# Patient Record
Sex: Male | Born: 1995 | Race: Asian | Hispanic: No | Marital: Single | Smoking: Never smoker
Health system: Southern US, Community
[De-identification: ages and names within clinical notes are randomized; demographics above are authoritative.]

## PROBLEM LIST (undated history)

## (undated) DIAGNOSIS — F419 Anxiety disorder, unspecified: Secondary | ICD-10-CM

## (undated) DIAGNOSIS — R7989 Other specified abnormal findings of blood chemistry: Secondary | ICD-10-CM

## (undated) DIAGNOSIS — R0789 Other chest pain: Secondary | ICD-10-CM

## (undated) HISTORY — DX: Other chest pain: R07.89

## (undated) HISTORY — DX: Other specified abnormal findings of blood chemistry: R79.89

## (undated) HISTORY — DX: Anxiety disorder, unspecified: F41.9

---

## 2018-01-03 DIAGNOSIS — M545 Low back pain: Secondary | ICD-10-CM

## 2018-01-03 DIAGNOSIS — G8929 Other chronic pain: Secondary | ICD-10-CM | POA: Insufficient documentation

## 2018-07-01 ENCOUNTER — Emergency Department (HOSPITAL_BASED_OUTPATIENT_CLINIC_OR_DEPARTMENT_OTHER)
Admission: EM | Admit: 2018-07-01 | Discharge: 2018-07-02 | Disposition: A | Discharge status: Home / Self Care | Payer: PRIVATE HEALTH INSURANCE | Attending: Emergency Medicine | Admitting: Emergency Medicine

## 2018-07-01 ENCOUNTER — Encounter (HOSPITAL_BASED_OUTPATIENT_CLINIC_OR_DEPARTMENT_OTHER): Payer: Self-pay | Admitting: *Deleted

## 2018-07-01 ENCOUNTER — Other Ambulatory Visit: Payer: Self-pay

## 2018-07-01 ENCOUNTER — Emergency Department (HOSPITAL_BASED_OUTPATIENT_CLINIC_OR_DEPARTMENT_OTHER): Payer: PRIVATE HEALTH INSURANCE

## 2018-07-01 DIAGNOSIS — F419 Anxiety disorder, unspecified: Secondary | ICD-10-CM

## 2018-07-01 DIAGNOSIS — R0789 Other chest pain: Secondary | ICD-10-CM | POA: Diagnosis not present

## 2018-07-01 DIAGNOSIS — R7989 Other specified abnormal findings of blood chemistry: Secondary | ICD-10-CM

## 2018-07-01 DIAGNOSIS — R778 Other specified abnormalities of plasma proteins: Secondary | ICD-10-CM

## 2018-07-01 DIAGNOSIS — R079 Chest pain, unspecified: Secondary | ICD-10-CM | POA: Diagnosis present

## 2018-07-01 LAB — CBC
HCT: 49.8 % (ref 39.0–52.0)
Hemoglobin: 16.2 g/dL (ref 13.0–17.0)
MCH: 29 pg (ref 26.0–34.0)
MCHC: 32.5 g/dL (ref 30.0–36.0)
MCV: 89.2 fL (ref 80.0–100.0)
PLATELETS: 271 10*3/uL (ref 150–400)
RBC: 5.58 MIL/uL (ref 4.22–5.81)
RDW: 13.2 % (ref 11.5–15.5)
WBC: 9.2 10*3/uL (ref 4.0–10.5)
nRBC: 0 % (ref 0.0–0.2)

## 2018-07-01 LAB — BASIC METABOLIC PANEL
Anion gap: 7 (ref 5–15)
BUN: 9 mg/dL (ref 6–20)
CO2: 27 mmol/L (ref 22–32)
Calcium: 9.1 mg/dL (ref 8.9–10.3)
Chloride: 102 mmol/L (ref 98–111)
Creatinine, Ser: 0.97 mg/dL (ref 0.61–1.24)
GFR calc Af Amer: >60 mL/min (ref >60)
Glucose, Bld: 92 mg/dL (ref 70–99)
POTASSIUM: 3.8 mmol/L (ref 3.5–5.1)
Sodium: 136 mmol/L (ref 135–145)

## 2018-07-01 LAB — D-DIMER, QUANTITATIVE: D-Dimer, Quant: <0.27 ug/mL-FEU (ref 0.00–0.50)

## 2018-07-01 LAB — TROPONIN I: TROPONIN I: 0.05 ng/mL — AB (ref <0.03)

## 2018-07-01 MED ORDER — LORAZEPAM 1 MG PO TABS
1.0000 mg | ORAL_TABLET | Freq: Once | ORAL | Status: AC
  Administered 2018-07-01: 1 mg via ORAL
  Filled 2018-07-01: qty 1

## 2018-07-01 MED ORDER — NAPROXEN 250 MG PO TABS
500.0000 mg | ORAL_TABLET | Freq: Once | ORAL | Status: AC
  Administered 2018-07-01: 500 mg via ORAL
  Filled 2018-07-01: qty 2

## 2018-07-01 NOTE — ED Notes (Signed)
Patient transported to X-ray 

## 2018-07-01 NOTE — ED Notes (Addendum)
Troponin 0.05. Dr. Wilkie AyeHorton aware and primary RN aware.

## 2018-07-01 NOTE — ED Notes (Signed)
Pt was asleep and resting when RN entered to introduce self and assess patient

## 2018-07-01 NOTE — ED Notes (Signed)
Spoke with Ted in lab and added on D-dimer

## 2018-07-01 NOTE — ED Triage Notes (Signed)
Pt reports chest pain x 2 days, worse "when I'm anxious"

## 2018-07-02 LAB — TROPONIN I: Troponin I: 0.04 ng/mL (ref <0.03)

## 2018-07-02 MED ORDER — INDOMETHACIN 50 MG PO CAPS: 50.0000 mg | ORAL_CAPSULE | Freq: Two times a day (BID) | ORAL | 0 refills | Status: DC

## 2018-07-02 NOTE — ED Notes (Signed)
Pt understood dc material. NAD noted. Script given at dc  

## 2018-07-02 NOTE — Discharge Instructions (Addendum)
Follow-up with cardiology for further evaluation and possible echocardiogram.  You were seen today for chest pain.  Your pain is very atypical and related to anxiety and stress.  However, 1 of your labs tests was mildly elevated.  You are very low risk for heart disease. Take indomethacin for pain.  If you develop worsening pain, positional pain, pain with exertion, fevers, shortness of breath, any new or worsening symptoms you should be reevaluated.

## 2018-07-02 NOTE — ED Notes (Signed)
Pt states he feels better. Resting quietly. Denies any cp/sob. resp even and unlabored.

## 2018-07-02 NOTE — ED Provider Notes (Signed)
[facility] EMERGENCY DEPARTMENT Provider Note   CSN: [id number] Arrival date & time: 07/01/18  2219     History   Chief Complaint Chief Complaint  Patient presents with  . Chest Pain    HPI Francis Taylor is a 22 y.o. male.  HPI  This is a 22 year old male who presents with chest pain.  Patient reports 2-day history of chest pain.  He describes the pain as pressure and nonradiating.  He states the pain mostly comes on when he is anxious and stressed.  He describes worsening pain when merging into traffic and dealing with stressful situations with his family.  He denies any exertional component to the pain.  Denies any positional component.  Pain is not worse with breathing.  Denies any history of blood clots.  Currently he rates his pain at 2 out of 10.  He has not taken anything for the pain.  Denies any recent fevers or infectious symptoms.  Denies cough.  Denies early family history of heart disease, hypertension, hyperlipidemia, diabetes, smoking.  History reviewed. No pertinent past medical history.  There are no active problems to display for this patient.   History reviewed. No pertinent surgical history.      Home Medications    Prior to Admission medications   Medication Sig Start Date End Date Taking? Authorizing Provider  indomethacin (INDOCIN) 50 MG capsule Take 1 capsule (50 mg total) by mouth 2 (two) times daily with a meal. 07/02/18   Laelynn Blizzard, Mayer Masker, MD    Family History No family history on file.  Social History Social History   Tobacco Use  . Smoking status: Never Smoker  . Smokeless tobacco: Never Used  Substance Use Topics  . Alcohol use: Yes    Comment: weekly  . Drug use: Never     Allergies   Patient has no known allergies.   Review of Systems Review of Systems  Constitutional: Negative for fever.  Respiratory: Negative for shortness of breath.   Cardiovascular: Positive for chest pain. Negative for leg swelling.    Gastrointestinal: Negative for nausea and vomiting.  Genitourinary: Negative for dysuria.  Musculoskeletal: Negative for back pain.  All other systems reviewed and are negative.    Physical Exam Updated Vital Signs BP 114/76   Pulse 62   Temp 97.7 F (36.5 C)   Resp 19   Ht 1.778 m (5\' 10" )   Wt 68 kg   SpO2 99%   BMI 21.52 kg/m   Physical Exam  Constitutional: He is oriented to person, place, and time. He appears well-developed and well-nourished. No distress.  HENT:  Head: Normocephalic and atraumatic.  Eyes: Pupils are equal, round, and reactive to light.  Neck: Neck supple.  Cardiovascular: Normal rate, regular rhythm, normal heart sounds and normal pulses.  No murmur heard. Pulmonary/Chest: Effort normal and breath sounds normal. No respiratory distress. He has no wheezes.  Abdominal: Soft. Bowel sounds are normal. There is no tenderness. There is no rebound.  Musculoskeletal: He exhibits no edema.  Lymphadenopathy:    He has no cervical adenopathy.  Neurological: He is alert and oriented to person, place, and time.  Skin: Skin is warm and dry.  Psychiatric: He has a normal mood and affect.  Nursing note and vitals reviewed.    ED Treatments / Results  Labs (all labs ordered are listed, but only abnormal results are displayed) Labs Reviewed  TROPONIN I - Abnormal; Notable for the following components:  Result Value   Troponin I 0.05 (*)    All other components within normal limits  TROPONIN I - Abnormal; Notable for the following components:   Troponin I 0.04 (*)    All other components within normal limits  BASIC METABOLIC PANEL  CBC  D-DIMER, QUANTITATIVE (NOT AT [facility])    EKG EKG Interpretation  Date/Time:  Sunday July 01 2018 22:32:32 EST Ventricular Rate:  62 PR Interval:  152 QRS Duration: 80 QT Interval:  384 QTC Calculation: 389 R Axis:   75 Text Interpretation:  Normal sinus rhythm Normal ECG Confirmed by Kenyan Karnes  ([id number]) on 07/01/2018 11:00:03 PM   Radiology Dg Chest 2 View  Result Date: 07/01/2018 CLINICAL DATA:  22 year old male with chest pain. EXAM: CHEST - 2 VIEW COMPARISON:  None. FINDINGS: The heart size and mediastinal contours are within normal limits. Both lungs are clear. The visualized skeletal structures are unremarkable. IMPRESSION: No active cardiopulmonary disease. Electronically Signed   By: Arash  Radparvar M.D.   On: 07/01/2018 22:57    Procedures Procedures (including critical care time)  Medications Ordered in ED Medications  LORazepam (ATIVAN) tablet 1 mg (1 mg Oral Given 07/01/18 2350)  naproxen (NAPROSYN) tablet 500 mg (500 mg Oral Given 07/01/18 2350)     Initial Impression / Assessment and Plan / ED Course  I have reviewed the triage vital signs and the nursing notes.  Pertinent labs & imaging results that were available during my care of the patient were reviewed by me and considered in my medical decision making (see chart for details).     This is a 22 year old male who presents with chest pain.  Very atypical in nature.  Appears to be mostly associated with stress and anxiety.  He appears anxious on exam.  Vital signs are within normal limits.  EKG is normal.  No signs of ischemia or arrhythmia.  Chest x-ray shows no evidence of pneumothorax or pneumonia.  Troponin minimally elevated at 0.05.  This is of unclear significance.  I do not feel that this represents true ACS.  He is low risk for blood clots.  Screening d-dimer was sent to evaluate for possible other causes of stress.  He satting 100% and his heart rate is 62.  His history does not suggest pericarditis or myocarditis.  He denies any recent infectious symptoms or fevers.  He denies any positional component.  D-dimer is negative.  Patient much improved after naproxen and Ativan.  Repeat troponin was obtained.  Repeat is 0.04.  This is reassuring.  Again, doubt true representation of ACS.  He remains  hemodynamically stable.  I discussed with him this abnormal result.  We will have him follow-up closely with cardiology for reevaluation and possible echocardiogram.  Patient stated understanding.  He was given strict return precautions.  After history, exam, and medical workup I feel the patient has been appropriately medically screened and is safe for discharge home. Pertinent diagnoses were discussed with the patient. Patient was given return precautions.   Final Clinical Impressions(s) / ED Diagnoses   Final diagnoses:  Atypical chest pain  Anxiety  Elevated troponin    ED Discharge Orders         Ordered    indomethacin (INDOCIN) 50 MG capsule  2 times daily with meals,   Status:  Discontinued     07/02/18 0215    indomethacin (INDOCIN) 50 MG capsule  2 times daily with meals     12 /09/19 [id number]  Shon Baton, MD 07/02/18 [phone]

## 2018-07-04 ENCOUNTER — Ambulatory Visit (INDEPENDENT_AMBULATORY_CARE_PROVIDER_SITE_OTHER): Payer: No Typology Code available for payment source | Admitting: Cardiovascular Disease

## 2018-07-04 ENCOUNTER — Encounter: Payer: Self-pay | Admitting: Cardiovascular Disease

## 2018-07-04 VITALS — BP 120/78 | HR 69 | Ht 70.0 in | Wt 158.2 lb

## 2018-07-04 DIAGNOSIS — F419 Anxiety disorder, unspecified: Secondary | ICD-10-CM | POA: Diagnosis not present

## 2018-07-04 DIAGNOSIS — R079 Chest pain, unspecified: Secondary | ICD-10-CM | POA: Diagnosis not present

## 2018-07-04 DIAGNOSIS — R7989 Other specified abnormal findings of blood chemistry: Secondary | ICD-10-CM | POA: Insufficient documentation

## 2018-07-04 DIAGNOSIS — Z01812 Encounter for preprocedural laboratory examination: Secondary | ICD-10-CM | POA: Diagnosis not present

## 2018-07-04 DIAGNOSIS — R778 Other specified abnormalities of plasma proteins: Secondary | ICD-10-CM

## 2018-07-04 DIAGNOSIS — R0789 Other chest pain: Secondary | ICD-10-CM | POA: Insufficient documentation

## 2018-07-04 HISTORY — DX: Other chest pain: R07.89

## 2018-07-04 HISTORY — DX: Other specified abnormalities of plasma proteins: R77.8

## 2018-07-04 HISTORY — DX: Anxiety disorder, unspecified: F41.9

## 2018-07-04 MED ORDER — ESCITALOPRAM OXALATE 10 MG PO TABS: 10.0000 mg | ORAL_TABLET | Freq: Every day | ORAL | 3 refills | Status: AC

## 2018-07-04 MED ORDER — METOPROLOL TARTRATE 50 MG PO TABS: ORAL_TABLET | ORAL | 0 refills | Status: AC

## 2018-07-04 NOTE — Progress Notes (Signed)
Cardiology Office Note   Date:  07/04/2018   ID:  Francis Taylor, DOB July 20, 1996, MRN [id number]  PCP:  Patient, No Pcp Per  Cardiologist:   Chilton Si, MD   No chief complaint on file.    History of Present Illness: Francis Taylor is a 22 y.o. male who is being seen today for the evaluation of atypical chest pain at the request of No ref. provider found.  Francis Taylor was seen in the ED 07/01/18 with chest pain.  He notes that he has been under more stress lately.  It is mostly family related stress.  Prior to then he had been experiencing chest pain almost constantly for the preceding 4 days.  In the last 2 days it has been worse.  On the day he went to the emergency department he also started feeling numbness in his left arm.  In the ED EKG was negative for ischemia but troponin was mildly elevated to 0.05.  On repeat it was 0.04.  He was instructed to follow-up with cardiology as an outpatient.  Since then he continues to have chest pain.  He notes that the only time he does not experience it is when he is sleeping or when he exerts himself.  He went running for 2-1/2 miles and felt well.  There is no associated shortness of breath, nausea, or diaphoresis.  He has not experienced any lower extremity edema, orthopnea, or PND.  His roommate gave him a Xanax which helped.  He has noted that his heart rate is sometimes elevated.  When he is feeling stressed or anxious it goes as high as the 90s.  At baseline he is in the 60s.  He has been using a supplement of Ashwagandha and black pepper which helps with his back pain.  He started taking it several months ago.  He has minimal caffeine and EtOH intake.    Past Medical History:  Diagnosis Date  . Anxiety 07/04/2018  . Atypical chest pain 07/04/2018  . Troponin level elevated 07/04/2018    History reviewed. No pertinent surgical history.   Current Outpatient Medications  Medication Sig Dispense Refill  . ASHWAGANDHA PO Take 2  capsules by mouth daily before breakfast.    . escitalopram (LEXAPRO) 10 MG tablet Take 1 tablet (10 mg total) by mouth daily. 30 tablet 3  . metoprolol tartrate (LOPRESSOR) 50 MG tablet TAKE 1 TABLET 2 HOURS PRIOR TO CT 1 tablet 0   No current facility-administered medications for this visit.     Allergies:   Patient has no known allergies.    Social History:  The patient  reports that he has never smoked. He has never used smokeless tobacco. He reports that he drinks alcohol. He reports that he does not use drugs.   Family History:  The patient's family history includes Anxiety disorder in his mother and sister; Diabetes in his paternal aunt; Lymphoma in his paternal grandfather.    ROS:  Please see the history of present illness.   Otherwise, review of systems are positive for none.   All other systems are reviewed and negative.    PHYSICAL EXAM: VS:  BP 120/78 Comment: right arm  Pulse 69   Ht 5\' 10"  (1.778 m)   Wt 158 lb 3.2 oz (71.8 kg)   BMI 22.70 kg/m  , BMI Body mass index is 22.7 kg/m. GENERAL:  Well appearing.  Anxious HEENT:  Pupils equal round and reactive, fundi not visualized, oral mucosa unremarkable  NECK:  No jugular venous distention, waveform within normal limits, carotid upstroke brisk and symmetric, no bruits, no thyromegaly LYMPHATICS:  No cervical adenopathy LUNGS:  Clear to auscultation bilaterally HEART:  RRR.  PMI not displaced or sustained,S1 and S2 within normal limits, no S3, no S4, no clicks, no rubs, no murmurs ABD:  Flat, positive bowel sounds normal in frequency in pitch, no bruits, no rebound, no guarding, no midline pulsatile mass, no hepatomegaly, no splenomegaly EXT:  2 plus pulses throughout, no edema, no cyanosis no clubbing SKIN:  No rashes no nodules NEURO:  Cranial nerves II through XII grossly intact, motor grossly intact throughout PSYCH:  Cognitively intact, oriented to person place and time    EKG:  EKG is not ordered today. The  ekg ordered 07/01/18 demonstrates sinus rhythm.  Rate 62 bpm.    Recent Labs: 07/01/2018: BUN 9; Creatinine, Ser 0.97; Hemoglobin 16.2; Platelets 271; Potassium 3.8; Sodium 136    Lipid Panel No results found for: CHOL, TRIG, HDL, CHOLHDL, VLDL, LDLCALC, LDLDIRECT    Wt Readings from Last 3 Encounters:  07/04/18 158 lb 3.2 oz (71.8 kg)  07/01/18 150 lb (68 kg)      ASSESSMENT AND PLAN:  # Atypical chest pain: # Positive troponin: Symptoms are atypical.  However it is odd that troponin is elevated, albeit very mild.  Lead V3 in his EKG is elevated and concave upward, though this is not diagnostic in a single lead.  We will get a coronary CT-A to better evaluate.    # Anxiety: Mr. Francis Taylor is very anxious and had a history of panic attacks. We will start escitalopram 10mg .  Recommended that he find a PCP.   # Elevated BP: BP OK here but he reports it has been elevated at home.  Continue to monitor for now.      Current medicines are reviewed at length with the patient today.  The patient does not have concerns regarding medicines.  The following changes have been made:  Start Lexapro.  Stop indomethacin.   Labs/ tests ordered today include:   Orders Placed This Encounter  Procedures  . CT CORONARY MORPH W/CTA COR W/SCORE W/CA W/CM &/OR WO/CM  . CT CORONARY FRACTIONAL FLOW RESERVE DATA PREP  . CT CORONARY FRACTIONAL FLOW RESERVE FLUID ANALYSIS  . Basic metabolic panel     Disposition:   FU with Kelli Robeck C. Duke Salviaandolph, MD, [facility] in 2 months.      Signed, Bryndon Cumbie C. Duke Salviaandolph, MD, [facility]  07/04/2018 5:28 PM    [facility]

## 2018-07-04 NOTE — Patient Instructions (Addendum)
Medication Instructions:  START LEXAPRO 10 MG DAILY   STOP INDOCIN   TAKE METOPROLOL 50 MG 1 TABLET 2 HOURS PRIOR TO CT  If you need a refill on your cardiac medications before your next appointment, please call your pharmacy.   Lab work: BMET 1 WEEK PRIOR TO CT  If you have labs (blood work) drawn today and your tests are completely normal, you will receive your results only by: Marland Kitchen MyChart Message (if you have MyChart) OR . A paper copy in the mail If you have any lab test that is abnormal or we need to change your treatment, we will call you to review the results.  Testing/Procedures: Your physician has requested that you have cardiac CT. Cardiac computed tomography (CT) is a painless test that uses an x-ray machine to take clear, detailed pictures of your heart. For further information please visit [email or web address] Please follow instruction sheet as given. THE OFFICE WILL CALL YOU TO SCHEDULE ONCE THE INSURANCE HAS APPROVED   Follow-Up: At [facility], you and your health needs are our priority.  As part of our continuing mission to provide you with exceptional heart care, we have created designated Provider Care Teams.  These Care Teams include your primary Cardiologist (physician) and Advanced Practice Providers (APPs -  Physician Assistants and Nurse Practitioners) who all work together to provide you with the care you need, when you need it. You will need a follow up appointment in 2 months. You may see DR [facility]  or one of the following Advanced Practice Providers on your designated Care Team:   Corine Shelter, PA-C Judy Pimple, [place] . Marjie Skiff, PA-C  Any Other Special Instructions Will Be Listed Below (If Applicable).  Please arrive at the [facility] main entrance of [facility] at xx:xx AM (30-45 minutes prior to test start time)  [facility] [address] [place] [facility] [place], [place] [address] [phone]  Proceed to the [facility] Radiology  Department (First Floor).  Please follow these instructions carefully (unless otherwise directed):  Hold all erectile dysfunction medications at least 48 hours prior to test.  On the Night Before the Test: . Be sure to Drink plenty of water. . Do not consume any caffeinated/decaffeinated beverages or chocolate 12 hours prior to your test. . Do not take any antihistamines 12 hours prior to your test. . If you take Metformin do not take 24 hours prior to test. . If the patient has contrast allergy: ? Patient will need a prescription for Prednisone and very clear instructions (as follows): 1. Prednisone 50 mg - take 13 hours prior to test 2. Take another Prednisone 50 mg 7 hours prior to test 3. Take another Prednisone 50 mg 1 hour prior to test 4. Take Benadryl 50 mg 1 hour prior to test . Patient must complete all four doses of above prophylactic medications. . Patient will need a ride after test due to Benadryl.  On the Day of the Test: . Drink plenty of water. Do not drink any water within one hour of the test. . Do not eat any food 4 hours prior to the test. . You may take your regular medications prior to the test.  . Take metoprolol (Lopressor) two hours prior to test. . HOLD Furosemide/Hydrochlorothiazide morning of the test.  Do not give Lopressor to patients with an allergy to lopressor or anyone with asthma or active COPD symptoms (currently taking steroids).       After the Test: .  Drink plenty of water. . After receiving IV contrast, you may experience a mild flushed feeling. This is normal. . On occasion, you may experience a mild rash up to 24 hours after the test. This is not dangerous. If this occurs, you can take Benadryl 25 mg and increase your fluid intake. . If you experience trouble breathing, this can be serious. If it is severe call 911 IMMEDIATELY. If it is mild, please call our office. . If you take any of these medications: Glipizide/Metformin, Avandament,  Glucavance, please do not take 48 hours after completing test.    Cardiac CT Angiogram A cardiac CT angiogram is a procedure to look at the heart and the area around the heart. It may be done to help find the cause of chest pains or other symptoms of heart disease. During this procedure, a large X-ray machine, called a CT scanner, takes detailed pictures of the heart and the surrounding area after a dye (contrast material) has been injected into blood vessels in the area. The procedure is also sometimes called a coronary CT angiogram, coronary artery scanning, or CTA. A cardiac CT angiogram allows the health care provider to see how well blood is flowing to and from the heart. The health care provider will be able to see if there are any problems, such as:  Blockage or narrowing of the coronary arteries in the heart.  Fluid around the heart.  Signs of weakness or disease in the muscles, valves, and tissues of the heart.  Tell a health care provider about:  Any allergies you have. This is especially important if you have had a previous allergic reaction to contrast dye.  All medicines you are taking, including vitamins, herbs, eye drops, creams, and over-the-counter medicines.  Any blood disorders you have.  Any surgeries you have had.  Any medical conditions you have.  Whether you are pregnant or may be pregnant.  Any anxiety disorders, chronic pain, or other conditions you have that may increase your stress or prevent you from lying still. What are the risks? Generally, this is a safe procedure. However, problems may occur, including:  Bleeding.  Infection.  Allergic reactions to medicines or dyes.  Damage to other structures or organs.  Kidney damage from the dye or contrast that is used.  Increased risk of cancer from radiation exposure. This risk is low. Talk with your health care provider about: ? The risks and benefits of testing. ? How you can receive the lowest  dose of radiation.  What happens before the procedure?  Wear comfortable clothing and remove any jewelry, glasses, dentures, and hearing aids.  Follow instructions from your health care provider about eating and drinking. This may include: ? For 12 hours before the test - avoid caffeine. This includes tea, coffee, soda, energy drinks, and diet pills. Drink plenty of water or other fluids that do not have caffeine in them. Being well-hydrated can prevent complications. ? For 4-6 hours before the test - stop eating and drinking. The contrast dye can cause nausea, but this is less likely if your stomach is empty.  Ask your health care provider about changing or stopping your regular medicines. This is especially important if you are taking diabetes medicines, blood thinners, or medicines to treat erectile dysfunction. What happens during the procedure?  Hair on your chest may need to be removed so that small sticky patches called electrodes can be placed on your chest. These will transmit information that helps to monitor your  heart during the test.  An IV tube will be inserted into one of your veins.  You might be given a medicine to control your heart rate during the test. This will help to ensure that good images are obtained.  You will be asked to lie on an exam table. This table will slide in and out of the CT machine during the procedure.  Contrast dye will be injected into the IV tube. You might feel warm, or you may get a metallic taste in your mouth.  You will be given a medicine (nitroglycerin) to relax (dilate) the arteries in your heart.  The table that you are lying on will move into the CT machine tunnel for the scan.  The person running the machine will give you instructions while the scans are being done. You may be asked to: ? Keep your arms above your head. ? Hold your breath. ? Stay very still, even if the table is moving.  When the scanning is complete, you will be  moved out of the machine.  The IV tube will be removed. The procedure may vary among health care providers and hospitals. What happens after the procedure?  You might feel warm, or you may get a metallic taste in your mouth from the contrast dye.  You may have a headache from the nitroglycerin.  After the procedure, drink water or other fluids to wash (flush) the contrast material out of your body.  Contact a health care provider if you have any symptoms of allergy to the contrast. These symptoms include: ? Shortness of breath. ? Rash or hives. ? A racing heartbeat.  Most people can return to their normal activities right after the procedure. Ask your health care provider what activities are safe for you.  It is up to you to get the results of your procedure. Ask your health care provider, or the department that is doing the procedure, when your results will be ready. Summary  A cardiac CT angiogram is a procedure to look at the heart and the area around the heart. It may be done to help find the cause of chest pains or other symptoms of heart disease.  During this procedure, a large X-ray machine, called a CT scanner, takes detailed pictures of the heart and the surrounding area after a dye (contrast material) has been injected into blood vessels in the area.  Ask your health care provider about changing or stopping your regular medicines before the procedure. This is especially important if you are taking diabetes medicines, blood thinners, or medicines to treat erectile dysfunction.  After the procedure, drink water or other fluids to wash (flush) the contrast material out of your body. This information is not intended to replace advice given to you by your health care provider. Make sure you discuss any questions you have with your health care provider. Document Released: 06/23/2008 Document Revised: 05/30/2016 Document Reviewed: 05/30/2016 Elsevier Interactive Patient Education   2017 [employer].

## 2018-08-01 ENCOUNTER — Telehealth: Payer: Self-pay | Admitting: Cardiovascular Disease

## 2018-08-01 NOTE — Telephone Encounter (Signed)
Spoke with patient to make his appointment for cardiac ct.  Patient declined the Ct due do to he is feeling better from the medication.   He is aware that he has a f/u appointment  09/24/18.   Order will be cancel.

## 2018-08-01 NOTE — Telephone Encounter (Signed)
Dr Duke Salvia aware and will assess at follow up

## 2018-09-24 ENCOUNTER — Encounter (INDEPENDENT_AMBULATORY_CARE_PROVIDER_SITE_OTHER): Payer: Self-pay

## 2018-09-24 ENCOUNTER — Encounter: Payer: Self-pay | Admitting: Cardiovascular Disease

## 2018-09-24 ENCOUNTER — Ambulatory Visit (INDEPENDENT_AMBULATORY_CARE_PROVIDER_SITE_OTHER): Payer: No Typology Code available for payment source | Admitting: Cardiovascular Disease

## 2018-09-24 VITALS — BP 110/78 | HR 61 | Ht 70.0 in | Wt 164.0 lb

## 2018-09-24 DIAGNOSIS — F419 Anxiety disorder, unspecified: Secondary | ICD-10-CM

## 2018-09-24 DIAGNOSIS — R079 Chest pain, unspecified: Secondary | ICD-10-CM | POA: Diagnosis not present

## 2018-09-24 DIAGNOSIS — Z13220 Encounter for screening for lipoid disorders: Secondary | ICD-10-CM | POA: Diagnosis not present

## 2018-09-24 NOTE — Patient Instructions (Signed)
Medication Instructions:  Your physician recommends that you continue on your current medications as directed. Please refer to the Current Medication list given to you today.  If you need a refill on your cardiac medications before your next appointment, please call your pharmacy.   Lab work: FASTING LP/CMET SOON   If you have labs (blood work) drawn today and your tests are completely normal, you will receive your results only by: Marland Kitchen MyChart Message (if you have MyChart) OR . A paper copy in the mail If you have any lab test that is abnormal or we need to change your treatment, we will call you to review the results.  Testing/Procedures: NONE  Follow-Up: AS NEEDED

## 2018-09-24 NOTE — Progress Notes (Signed)
Cardiology Office Note   Date:  09/24/2018   ID:  Francis Taylor, DOB August 12, 1995, MRN [id number]  PCP:  Patient, No Pcp Per  Cardiologist:   Chilton Si, MD   No chief complaint on file.    History of Present Illness: Francis Taylor is a 23 y.o. male here for follow up on chest pain.  Francis Taylor was seen in the ED 07/01/18 with chest pain.  He notes that he has been under more stress lately.  It is mostly family related stress.  Prior to then he had been experiencing chest pain almost constantly for the preceding 4 days.  In the last 2 days it has been worse.  On the day he went to the emergency department he also started feeling numbness in his left arm.  In the ED EKG was negative for ischemia but troponin was mildly elevated to 0.05.  On repeat it was 0.04.  He was instructed to follow-up with cardiology as an outpatient.  At that appointment his symptoms were very atypical and it was recommended that he get a coronary CT-A.  He was also started on escitalopram, as most of his symptoms seemed to be attributable to anxiety.     [name] initially felt better after starting escitalopram.  He decided not go get the cardiac CT given that he was feeling better.  However lately he has experienced more chest pain.  He runs for 2-4 miles daily and has no chest pain or shortness of breath with exertion.  The chest pain occurs when he is at work or lying in bed at night.  It is associated with shortness of breath and heart pounding.  He has no lightheadedness or dizziness.  He denies lower extremity edema, orthopnea or PND.    Past Medical History:  Diagnosis Date  . Anxiety 07/04/2018  . Atypical chest pain 07/04/2018  . Troponin level elevated 07/04/2018    History reviewed. No pertinent surgical history.   Current Outpatient Medications  Medication Sig Dispense Refill  . ASHWAGANDHA PO Take 2 capsules by mouth daily before breakfast.    . escitalopram (LEXAPRO) 10 MG  tablet Take 1 tablet (10 mg total) by mouth daily. 30 tablet 3  . metoprolol tartrate (LOPRESSOR) 50 MG tablet TAKE 1 TABLET 2 HOURS PRIOR TO CT 1 tablet 0   No current facility-administered medications for this visit.     Allergies:   Patient has no known allergies.    Social History:  The patient  reports that he has never smoked. He has never used smokeless tobacco. He reports current alcohol use. He reports that he does not use drugs.   Family History:  The patient's family history includes Anxiety disorder in his mother and sister; Diabetes in his paternal aunt; Lymphoma in his paternal grandfather.    ROS:  Please see the history of present illness.   Otherwise, review of systems are positive for none.   All other systems are reviewed and negative.    PHYSICAL EXAM: VS:  BP 110/78   Pulse 61   Ht 5\' 10"  (1.778 m)   Wt 164 lb (74.4 kg)   SpO2 98%   BMI 23.53 kg/m  , BMI Body mass index is 23.53 kg/m. GENERAL:  Well appearing HEENT: Pupils equal round and reactive, fundi not visualized, oral mucosa unremarkable NECK:  No jugular venous distention, waveform within normal limits, carotid upstroke brisk and symmetric, no bruits LUNGS:  Clear to auscultation bilaterally HEART:  RRR.  PMI not displaced or sustained,S1 and S2 within normal limits, no S3, no S4, no clicks, no rubs, no murmurs ABD:  Flat, positive bowel sounds normal in frequency in pitch, no bruits, no rebound, no guarding, no midline pulsatile mass, no hepatomegaly, no splenomegaly EXT:  2 plus pulses throughout, no edema, no cyanosis no clubbing SKIN:  No rashes no nodules NEURO:  Cranial nerves II through XII grossly intact, motor grossly intact throughout PSYCH:  Cognitively intact, oriented to person place and time   EKG:  EKG is not ordered today. The ekg ordered 07/01/18 demonstrates sinus rhythm.  Rate 62 bpm.    Recent Labs: 07/01/2018: BUN 9; Creatinine, Ser 0.97; Hemoglobin 16.2; Platelets 271;  Potassium 3.8; Sodium 136    Lipid Panel No results found for: CHOL, TRIG, HDL, CHOLHDL, VLDL, LDLCALC, LDLDIRECT    Wt Readings from Last 3 Encounters:  09/24/18 164 lb (74.4 kg)  07/04/18 158 lb 3.2 oz (71.8 kg)  07/01/18 150 lb (68 kg)      ASSESSMENT AND PLAN:  # Atypical chest pain: # Positive troponin: Symptoms are atypical.  However it is odd that troponin is elevated, albeit very mild.  Lead V3 in his EKG is elevated and concave upward, though this is not diagnostic in a single lead. He was referred for a coronary CT but decided not to get it given that he was feeling better.  The fact that he runs 2 to 4 miles daily and has no exertional symptoms made it is very unlikely that this is due to ischemia.  I suspect this is much more related to his anxiety.  Check fasting lipids and CMP.  # Anxiety: Symptoms are somewhat better since starting escitalopram.  I recommended that he follow-up with his PCP and consider counseling.  # Elevated BP: BP OK here but he reports it has been elevated at home.  Continue to monitor for now.   Time spent: 35 minutes-Greater than 50% of this time was spent in counseling, explanation of diagnosis, planning of further management, and coordination of care.    Current medicines are reviewed at length with the patient today.  The patient does not have concerns regarding medicines.  The following changes have been made:  none  Labs/ tests ordered today include:   Orders Placed This Encounter  Procedures  . Lipid panel  . Comprehensive metabolic panel     Disposition:   FU with Medora Roorda C. Duke Salvia, MD, [facility] as needed.    Signed, Nishita Isaacks C. Duke Salvia, MD, [facility]  09/24/2018 8:27 PM    [facility]

## 2020-03-02 IMAGING — CR DG CHEST 2V
2 series · 2 of 2 positions shown · non-contrast
Comparison: None.

CLINICAL DATA: 22-year-old male with chest pain.

EXAM:
CHEST - 2 VIEW

[w chest pa]
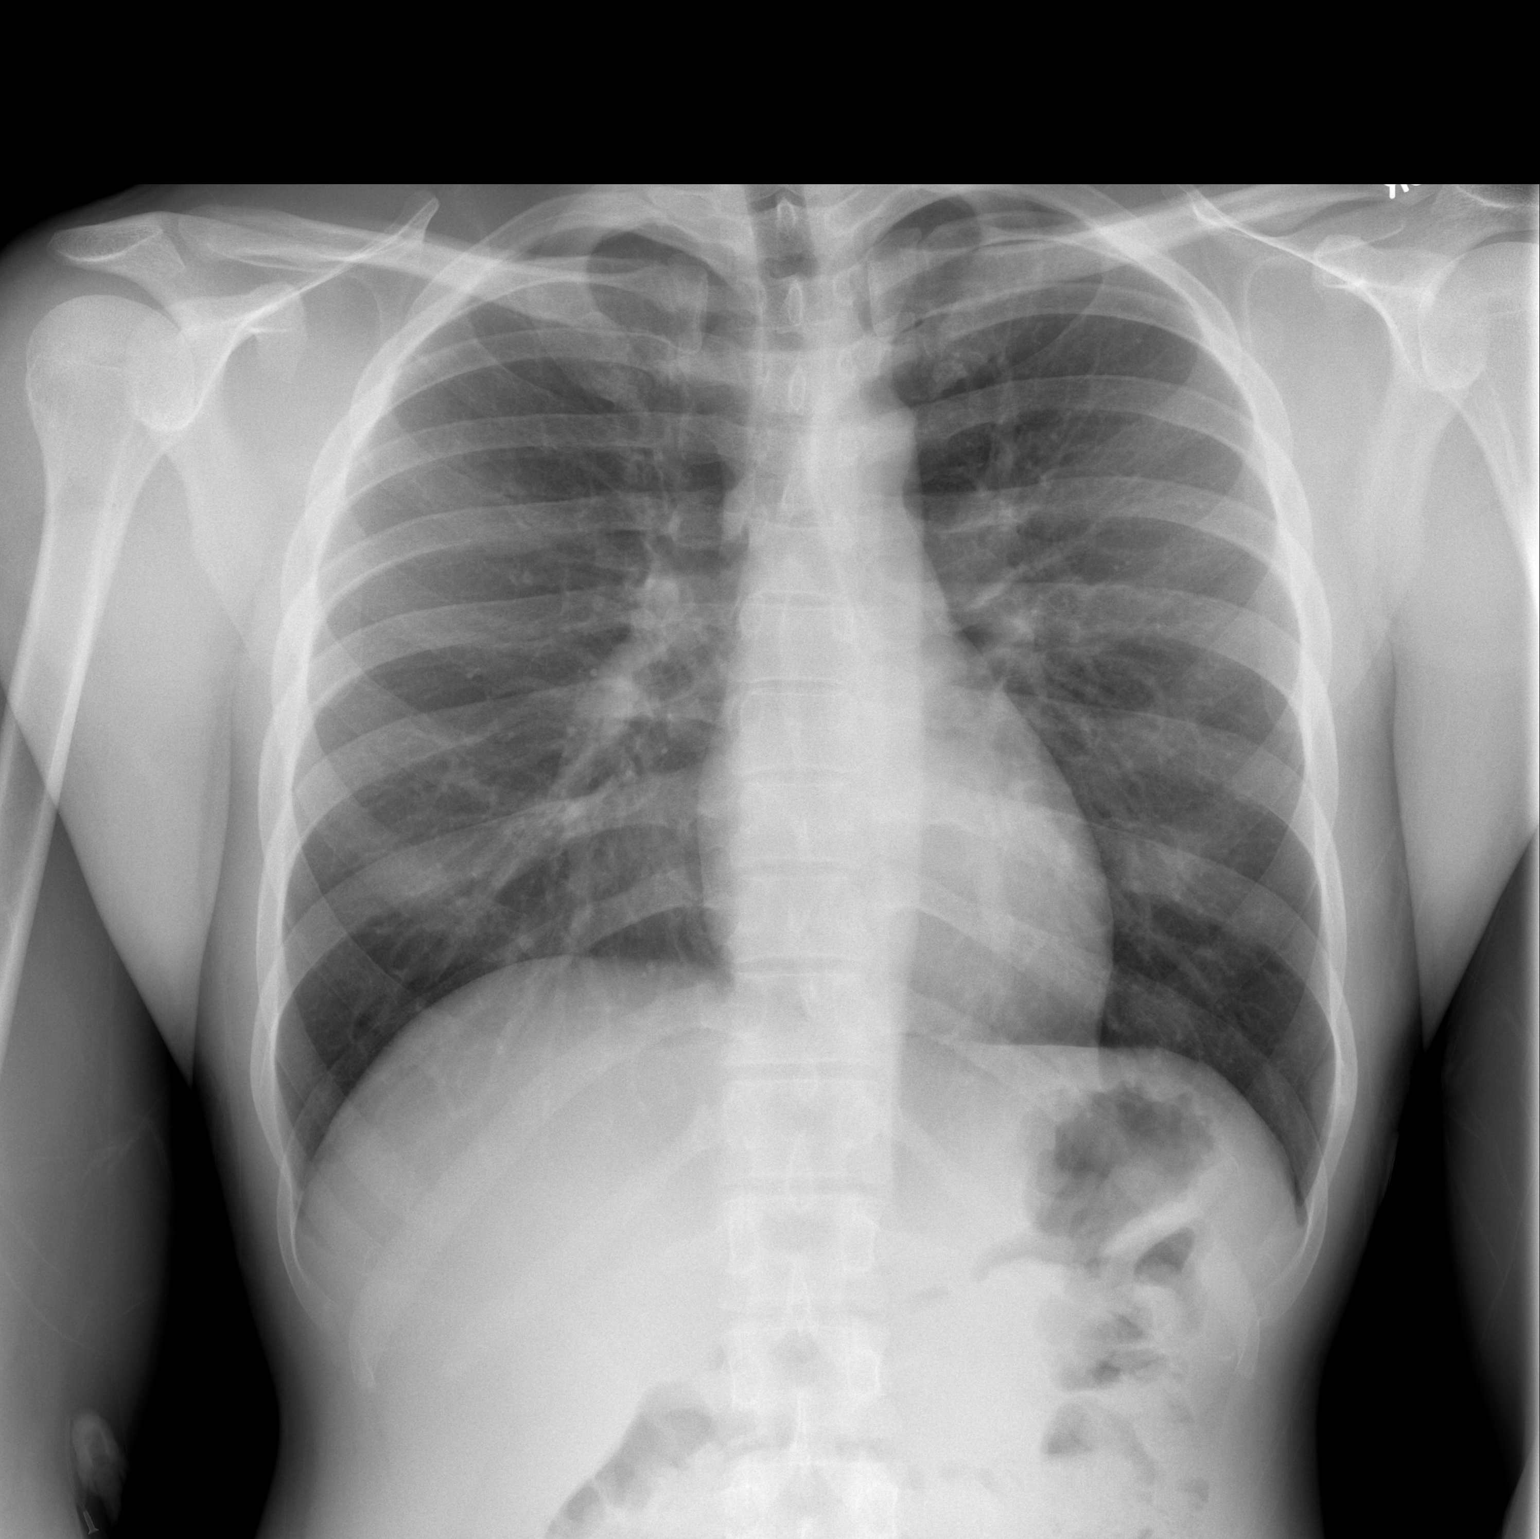

[w chest lat]
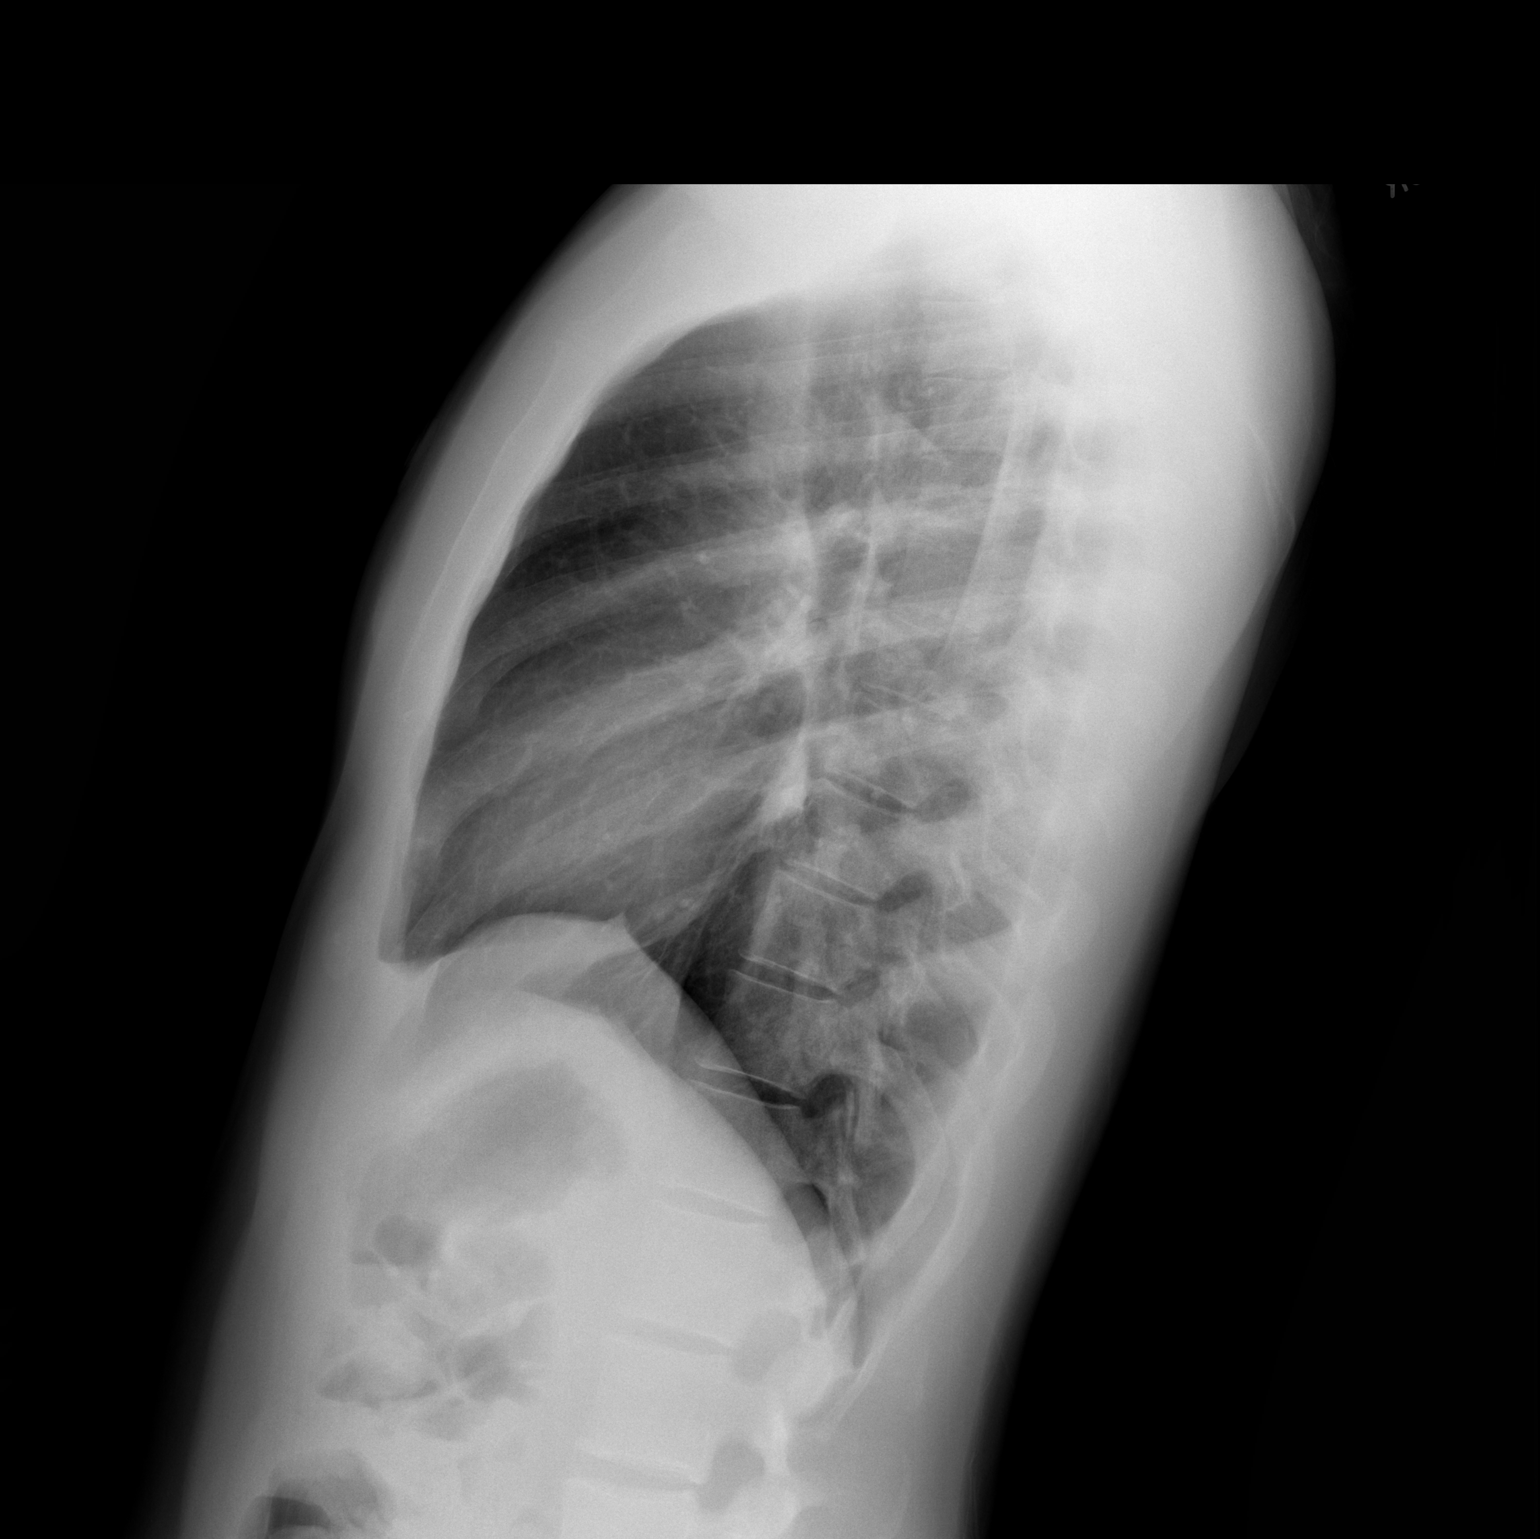

[2 of 2 positions shown; findings below may reference images not displayed]

FINDINGS: The heart size and mediastinal contours are within normal limits.
Both lungs are clear. The visualized skeletal structures are
unremarkable.
IMPRESSION: No active cardiopulmonary disease.
